# Patient Record
Sex: Female | Born: 2000 | Race: White | Hispanic: No | Marital: Single | Smoking: Current every day smoker
Health system: Southern US, Community
[De-identification: ages and names within clinical notes are randomized; demographics above are authoritative.]

## PROBLEM LIST (undated history)

## (undated) ENCOUNTER — Emergency Department (HOSPITAL_COMMUNITY): Disposition: A | Discharge status: Home / Self Care | Payer: Self-pay

## (undated) DIAGNOSIS — T07XXXA Unspecified multiple injuries, initial encounter: Secondary | ICD-10-CM

## (undated) DIAGNOSIS — T148XXA Other injury of unspecified body region, initial encounter: Secondary | ICD-10-CM

---
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
## 2018-01-01 ENCOUNTER — Other Ambulatory Visit: Payer: Self-pay | Admitting: Family Medicine

## 2018-01-09 ENCOUNTER — Other Ambulatory Visit (HOSPITAL_COMMUNITY): Payer: Self-pay | Admitting: Psychiatry

## 2018-01-28 ENCOUNTER — Telehealth: Payer: Self-pay | Admitting: Family Medicine

## 2018-01-28 NOTE — Telephone Encounter (Signed)
Left msg for pt to call and schedule

## 2018-01-28 NOTE — Telephone Encounter (Signed)
You are overdue for some basic health care including physical and some screening test.  Please make a follow-up appointment with me in the near future.

## 2018-01-28 NOTE — Telephone Encounter (Signed)
Please schedule

## 2018-02-02 NOTE — Telephone Encounter (Signed)
Can you please assist in following up?  Thanks!!

## 2018-02-10 NOTE — Telephone Encounter (Signed)
Left pt to call me back on my extension.

## 2018-03-15 ENCOUNTER — Ambulatory Visit (INDEPENDENT_AMBULATORY_CARE_PROVIDER_SITE_OTHER): Payer: Managed Care, Other (non HMO) | Admitting: Family Medicine

## 2018-03-15 ENCOUNTER — Ambulatory Visit (INDEPENDENT_AMBULATORY_CARE_PROVIDER_SITE_OTHER): Payer: Managed Care, Other (non HMO)

## 2018-03-15 ENCOUNTER — Encounter: Payer: Self-pay | Admitting: Family Medicine

## 2018-03-15 VITALS — BP 117/73 | HR 89 | Temp 98.2°F | Wt 121.0 lb

## 2018-03-15 DIAGNOSIS — Z113 Encounter for screening for infections with a predominantly sexual mode of transmission: Secondary | ICD-10-CM | POA: Diagnosis not present

## 2018-03-15 DIAGNOSIS — R05 Cough: Secondary | ICD-10-CM

## 2018-03-15 DIAGNOSIS — F3181 Bipolar II disorder: Secondary | ICD-10-CM

## 2018-03-15 DIAGNOSIS — R059 Cough, unspecified: Secondary | ICD-10-CM

## 2018-03-15 MED ORDER — ALBUTEROL SULFATE HFA 108 (90 BASE) MCG/ACT IN AERS: 2.0000 | INHALATION_SPRAY | Freq: Four times a day (QID) | RESPIRATORY_TRACT | 0 refills | Status: DC | PRN

## 2018-03-15 MED ORDER — BENZONATATE 200 MG PO CAPS: 200.0000 mg | ORAL_CAPSULE | Freq: Three times a day (TID) | ORAL | 0 refills | Status: DC | PRN

## 2018-03-15 MED ORDER — PREDNISONE 10 MG PO TABS: 30.0000 mg | ORAL_TABLET | Freq: Every day | ORAL | 0 refills | Status: DC

## 2018-03-15 MED ORDER — AZITHROMYCIN 250 MG PO TABS: 250.0000 mg | ORAL_TABLET | Freq: Every day | ORAL | 0 refills | Status: DC

## 2018-03-15 MED ORDER — IPRATROPIUM BROMIDE 0.06 % NA SOLN: 2.0000 | NASAL | 6 refills | Status: DC | PRN

## 2018-03-15 NOTE — Patient Instructions (Addendum)
Thank you for coming in today. Call or go to the emergency room if you get worse, have trouble breathing, have chest pains, or palpitations.  Use albuterol as needed.  Use tessalon pearles as needed for cough.  .Take prednisone for lung inflimation.  Back up azithromycin antibiotic if not better.   Recheck in a few weeks.  Return sooner if needed.    Acute Bronchitis, Adult Acute bronchitis is when air tubes (bronchi) in the lungs suddenly get swollen. The condition can make it hard to breathe. It can also cause these symptoms:  A cough.  Coughing up clear, yellow, or green mucus.  Wheezing.  Chest congestion.  Shortness of breath.  A fever.  Body aches.  Chills.  A sore throat.  Follow these instructions at home: Medicines  Take over-the-counter and prescription medicines only as told by your doctor.  If you were prescribed an antibiotic medicine, take it as told by your doctor. Do not stop taking the antibiotic even if you start to feel better. General instructions  Rest.  Drink enough fluids to keep your pee (urine) clear or pale yellow.  Avoid smoking and secondhand smoke. If you smoke and you need help quitting, ask your doctor. Quitting will help your lungs heal faster.  Use an inhaler, cool mist vaporizer, or humidifier as told by your doctor.  Keep all follow-up visits as told by your doctor. This is important. How is this prevented? To lower your risk of getting this condition again:  Wash your hands often with soap and water. If you cannot use soap and water, use hand sanitizer.  Avoid contact with people who have cold symptoms.  Try not to touch your hands to your mouth, nose, or eyes.  Make sure to get the flu shot every year.  Contact a doctor if:  Your symptoms do not get better in 2 weeks. Get help right away if:  You cough up blood.  You have chest pain.  You have very bad shortness of breath.  You become dehydrated.  You faint  (pass out) or keep feeling like you are going to pass out.  You keep throwing up (vomiting).  You have a very bad headache.  Your fever or chills gets worse. This information is not intended to replace advice given to you by your health care provider. Make sure you discuss any questions you have with your health care provider. Document Released: 11/04/2007 Document Revised: 12/25/2015 Document Reviewed: 11/06/2015 Elsevier Interactive Patient Education  [employer].

## 2018-03-15 NOTE — Progress Notes (Signed)
Tracey [name] is a 17 y.o. female who presents to [name] today for cough congestion runny nose.  Cough is productive and frequent interfering with sleep.  Patient notes some shortness of breath.  She notes that her friend is sick with a similar illness and recently was diagnosed with pneumonia.  No vomiting or diarrhea chest pain or palpitations.   ROS as above:  Exam:  BP 117/73   Pulse 89   Temp 98.2 F (36.8 C) (Oral)   Wt 121 lb (54.9 kg)   SpO2 99%   BMI 21.43 kg/m  Wt Readings from Last 5 Encounters:  03/15/18 121 lb (54.9 kg) (37 %, Z= -0.32)*  05/31/17 143 lb (64.9 kg) (77 %, Z= 0.73)*  05/13/17 142 lb (64.4 kg) (76 %, Z= 0.70)*  04/26/17 144 lb (65.3 kg) (78 %, Z= 0.77)*  11/24/16 127 lb (57.6 kg) (56 %, Z= 0.14)*   * Growth percentiles are based on CDC (Girls, 2-20 Years) data.    Gen: Well NAD HEENT: EOMI,  MMM clear nasal discharge.  Posterior pharynx with cobblestoning.  Inflamed nasal turbinates bilaterally.  Tympanic membranes are normal-appearing bilaterally.  Cervical lymphadenopathy is present bilaterally. Lungs: Normal work of breathing.  Coarse breath sounds present bilaterally rhonchi right lower lung fields.  Frequent coughing present. Heart: RRR no MRG Abd: NABS, Soft. Nondistended, Nontender Exts: Brisk capillary refill, warm and well perfused.   Lab and Radiology Results  Dg Chest 2 View  Result Date: 03/15/2018 CLINICAL DATA:  Cough EXAM: CHEST - 2 VIEW COMPARISON:  None. FINDINGS: The heart size and mediastinal contours are within normal limits. Both lungs are clear. The visualized skeletal structures are unremarkable. IMPRESSION: No active cardiopulmonary disease. Electronically Signed   By: Jasmine Pang M.D.   On: 03/15/2018 22:59   I personally (independently) visualized and performed the interpretation of the images  attached in this note.  Assessment and Plan: 17 y.o. female with cough and congestion likely viral etiology.  Plan for continued over-the-counter medications.  Will use prednisone Tessalon Perles and albuterol for symptom control.  Back-up azithromycin prescribed if patient does not improve.  The patient is overdue for follow-up regarding her bipolar disorder as well as several screening tests including chlamydia screening.  Urine sample for chlamydia screening obtained today and patient will follow-up in a few weeks for recheck cough as well as follow-up her other medical problems.  Return sooner if needed.   Orders Placed This Encounter  Procedures  . C. trachomatis/N. gonorrhoeae RNA  . DG Chest 2 View    Order Specific Question:   Reason for exam:    Answer:   Cough, assess intra-thoracic pathology    Order Specific Question:   Is the patient pregnant?    Answer:   No    Order Specific Question:   Preferred imaging location?    Answer:   [facility]   Meds ordered this encounter  Medications  . predniSONE (DELTASONE) 10 MG tablet    Sig: Take 3 tablets (30 mg total) by mouth daily with breakfast.    Dispense:  15 tablet    Refill:  0  . azithromycin (ZITHROMAX) 250 MG tablet    Sig: Take 1 tablet (250 mg total) by mouth daily. Take first 2 tablets together, then 1 every day until finished.    Dispense:  6 tablet    Refill:  0  . benzonatate (TESSALON) 200  MG capsule    Sig: Take 1 capsule (200 mg total) by mouth 3 (three) times daily as needed for cough.    Dispense:  45 capsule    Refill:  0  . albuterol (PROVENTIL HFA;VENTOLIN HFA) 108 (90 Base) MCG/ACT inhaler    Sig: Inhale 2 puffs into the lungs every 6 (six) hours as needed for wheezing or shortness of breath.    Dispense:  1 Inhaler    Refill:  0  . ipratropium (ATROVENT) 0.06 % nasal spray    Sig: Place 2 sprays into both nostrils every 4 (four) hours as needed.    Dispense:  10 mL    Refill:  6      Historical information moved to improve visibility of documentation.  Past Medical History:  Diagnosis Date  . Fractures   . Sprains and strains    No past surgical history on file. Social History   Tobacco Use  . Smoking status: Current Every Day Smoker    Packs/day: 0.25  . Smokeless tobacco: Never Used  . Tobacco comment: 1 or 2 daily  Substance Use Topics  . Alcohol use: No   family history includes Bipolar disorder in her brother and mother.  Medications: Current Outpatient Medications  Medication Sig Dispense Refill  . busPIRone (BUSPAR) 7.5 MG tablet Take 1 tablet (7.5 mg total) by mouth 2 (two) times daily. 180 tablet 0  . divalproex (DEPAKOTE ER) 250 MG 24 hr tablet Take one during the day . 2 at night  . Total dose of 750mg  270 tablet 0  . PREVIFEM 0.25-35 MG-MCG tablet TAKE 1 TABLET DAILY AS     DIRECTED MAKE AN           APPOINTMENT FORFURTHER    REFILLS 30 tablet 0  . albuterol (PROVENTIL HFA;VENTOLIN HFA) 108 (90 Base) MCG/ACT inhaler Inhale 2 puffs into the lungs every 6 (six) hours as needed for wheezing or shortness of breath. 1 Inhaler 0  . azithromycin (ZITHROMAX) 250 MG tablet Take 1 tablet (250 mg total) by mouth daily. Take first 2 tablets together, then 1 every day until finished. 6 tablet 0  . benzonatate (TESSALON) 200 MG capsule Take 1 capsule (200 mg total) by mouth 3 (three) times daily as needed for cough. 45 capsule 0  . ipratropium (ATROVENT) 0.06 % nasal spray Place 2 sprays into both nostrils every 4 (four) hours as needed. 10 mL 6  . predniSONE (DELTASONE) 10 MG tablet Take 3 tablets (30 mg total) by mouth daily with breakfast. 15 tablet 0   No current facility-administered medications for this visit.    No Known Allergies   Discussed warning signs or symptoms. Please see discharge instructions. Patient expresses understanding.

## 2018-03-15 NOTE — Progress Notes (Signed)
Cough

## 2018-04-05 ENCOUNTER — Ambulatory Visit: Payer: Self-pay | Admitting: Family Medicine

## 2018-04-07 ENCOUNTER — Other Ambulatory Visit: Payer: Self-pay | Admitting: Family Medicine

## 2018-05-08 ENCOUNTER — Other Ambulatory Visit: Payer: Self-pay | Admitting: Family Medicine

## 2018-05-27 ENCOUNTER — Other Ambulatory Visit: Payer: Self-pay | Admitting: Family Medicine

## 2018-06-13 ENCOUNTER — Other Ambulatory Visit: Payer: Self-pay | Admitting: Family Medicine

## 2018-06-23 LAB — CBC AND DIFFERENTIAL: Hemoglobin: 10.7 — AB (ref 12.0–16.0)

## 2018-07-29 ENCOUNTER — Encounter: Payer: Self-pay | Admitting: Family Medicine

## 2018-07-29 ENCOUNTER — Ambulatory Visit (INDEPENDENT_AMBULATORY_CARE_PROVIDER_SITE_OTHER): Payer: BLUE CROSS/BLUE SHIELD | Admitting: Family Medicine

## 2018-07-29 VITALS — BP 120/80 | HR 62 | Wt 139.0 lb

## 2018-07-29 DIAGNOSIS — Z113 Encounter for screening for infections with a predominantly sexual mode of transmission: Secondary | ICD-10-CM

## 2018-07-29 DIAGNOSIS — N939 Abnormal uterine and vaginal bleeding, unspecified: Secondary | ICD-10-CM

## 2018-07-29 DIAGNOSIS — N926 Irregular menstruation, unspecified: Secondary | ICD-10-CM | POA: Diagnosis not present

## 2018-07-29 LAB — POCT URINE PREGNANCY: Preg Test, Ur: NEGATIVE

## 2018-07-29 MED ORDER — DROSPIRENONE-ETHINYL ESTRADIOL 3-0.03 MG PO TABS: 1.0000 | ORAL_TABLET | Freq: Every day | ORAL | 11 refills | Status: DC

## 2018-07-29 NOTE — Patient Instructions (Signed)
Thank you for coming in today. Get labs now.  STOP tri-sprintec.  START yasmen.  Follow up with OBGYN or let me know if you do not do well.    Menorrhagia  Menorrhagia is a condition in which menstrual periods are heavy or last longer than normal. With menorrhagia, most periods a woman has may cause enough blood loss and cramping that she becomes unable to take part in her usual activities. What are the causes? Common causes of this condition include:  Noncancerous growths in the uterus (polyps or fibroids).  An imbalance of the estrogen and progesterone hormones.  One of the ovaries not releasing an egg during one or more months.  A problem with the thyroid gland (hypothyroid).  Side effects of having an intrauterine device (IUD).  Side effects of some medicines, such as anti-inflammatory medicines or blood thinners.  A bleeding disorder that stops the blood from clotting normally. In some cases, the cause of this condition is not known. What are the signs or symptoms? Symptoms of this condition include:  Routinely having to change your pad or tampon every 1-2 hours because it is completely soaked.  Needing to use pads and tampons at the same time because of heavy bleeding.  Needing to wake up to change your pads or tampons during the night.  Passing blood clots larger than 1 inch (2.5 cm) in size.  Having bleeding that lasts for more than 7 days.  Having symptoms of low iron levels (anemia), such as tiredness, fatigue, or shortness of breath. How is this diagnosed? This condition may be diagnosed based on:  A physical exam.  Your symptoms and menstrual history.  Tests, such as: ? Blood tests to check if you are pregnant or have hormonal changes, a bleeding or thyroid disorder, anemia, or other problems. ? Pap test to check for cancerous changes, infections, or inflammation. ? Endometrial biopsy. This test involves removing a tissue sample from the lining of the  uterus (endometrium) to be examined under a microscope. ? Pelvic ultrasound. This test uses sound waves to create images of your uterus, ovaries, and vagina. The images can show if you have fibroids or other growths. ? Hysteroscopy. For this test, a small telescope is used to look inside your uterus. How is this treated? Treatment may not be needed for this condition. If it is needed, the best treatment for you will depend on:  Whether you need to prevent pregnancy.  Your desire to have children in the future.  The cause and severity of your bleeding.  Your personal preference. Medicines are the first step in treatment. You may be treated with:  Hormonal birth control methods. These treatments reduce bleeding during your menstrual period. They include: ? Birth control pills. ? Skin patch. ? Vaginal ring. ? Shots (injections) that you get every 3 months. ? Hormonal IUD (intrauterine device). ? Implants that go under the skin.  Medicines that thicken blood and slow bleeding.  Medicines that reduce swelling, such as ibuprofen.  Medicines that contain an artificial (synthetic) hormone called progestin.  Medicines that make the ovaries stop working for a short time.  Iron supplements to treat anemia. If medicines do not work, surgery may be done. Surgical options may include:  Dilation and curettage (D&C). In this procedure, your health care provider opens (dilates) your cervix and then scrapes or suctions tissue from the endometrium to reduce menstrual bleeding.  Operative hysteroscopy. In this procedure, a small tube with a light on the end (  hysteroscope) is used to view your uterus and help remove polyps that may be causing heavy periods.  Endometrial ablation. This is when various techniques are used to permanently destroy your entire endometrium. After endometrial ablation, most women have little or no menstrual flow. This procedure reduces your ability to become  pregnant.  Endometrial resection. In this procedure, an electrosurgical wire loop is used to remove the endometrium. This procedure reduces your ability to become pregnant.  Hysterectomy. This is surgical removal of the uterus. This is a permanent procedure that stops menstrual periods. Pregnancy is not possible after a hysterectomy. Follow these instructions at home: Medicines  Take over-the-counter and prescription medicines exactly as told by your health care provider. This includes iron pills.  Do not change or switch medicines without asking your health care provider.  Do not take aspirin or medicines that contain aspirin 1 week before or during your menstrual period. Aspirin may make bleeding worse. General instructions  If you need to change your sanitary pad or tampon more than once every 2 hours, limit your activity until the bleeding stops.  Iron pills can cause constipation. To prevent or treat constipation while you are taking prescription iron supplements, your health care provider may recommend that you: ? Drink enough fluid to keep your urine clear or pale yellow. ? Take over-the-counter or prescription medicines. ? Eat foods that are high in fiber, such as fresh fruits and vegetables, whole grains, and beans. ? Limit foods that are high in fat and processed sugars, such as fried and sweet foods.  Eat well-balanced meals, including foods that are high in iron. Foods that have a lot of iron include leafy green vegetables, meat, liver, eggs, and whole grain breads and cereals.  Do not try to lose weight until the abnormal bleeding has stopped and your blood iron level is back to normal. If you need to lose weight, work with your health care provider to lose weight safely.  Keep all follow-up visits as told by your health care provider. This is important. Contact a health care provider if:  You soak through a pad or tampon every 1 or 2 hours, and this happens every time you  have a period.  You need to use pads and tampons at the same time because you are bleeding so much.  You have nausea, vomiting, diarrhea, or other problems related to medicines you are taking. Get help right away if:  You soak through more than a pad or tampon in 1 hour.  You pass clots bigger than 1 inch (2.5 cm) wide.  You feel short of breath.  You feel like your heart is beating too fast.  You feel dizzy or faint.  You feel very weak or tired. Summary  Menorrhagia is a condition in which menstrual periods are heavy or last longer than normal.  Treatment will depend on the cause of the condition and may include medicines or procedures.  Take over-the-counter and prescription medicines exactly as told by your health care provider. This includes iron pills.  Get help right away if you have heavy bleeding that soaks through more than a pad or tampon in 1 hour, you are passing large clots, or you feel dizzy, faint or short of breath. This information is not intended to replace advice given to you by your health care provider. Make sure you discuss any questions you have with your health care provider. Document Released: 05/18/2005 Document Revised: 05/11/2016 Document Reviewed: 05/11/2016 Elsevier Interactive Patient Education  2019 [place].

## 2018-07-29 NOTE — Progress Notes (Signed)
Tracey [name] is a 18 y.o. female who presents to [name] today for heavy vaginal bleeding.  Tracey [name] has had ongoing vaginal bleeding for the past 2 months.  She has been taking Sprintec continuously which has not helped.  Patient was seen by her OB/GYN on January 23 and February 5.  She has been having vaginal bleeding and was switched to Tri-Lo-Sprintec after having breakthrough bleeding with Sprintec.  Point-of-care hemoglobin was 10.7.  Urine pregnancy was negative.  She notes some cramping but denies significant abdominal pain.  She notes that she had a pelvic exam at her OB/GYN recently. ROS as above:  Exam:  BP 120/80   Pulse 62   Wt 139 lb (63 kg)   BMI 24.62 kg/m  Wt Readings from Last 5 Encounters:  07/29/18 139 lb (63 kg) (68 %, Z= 0.47)*  03/15/18 121 lb (54.9 kg) (37 %, Z= -0.32)*  05/31/17 143 lb (64.9 kg) (77 %, Z= 0.73)*  05/13/17 142 lb (64.4 kg) (76 %, Z= 0.70)*  04/26/17 144 lb (65.3 kg) (78 %, Z= 0.77)*   * Growth percentiles are based on CDC (Girls, 2-20 Years) data.    Gen: Well NAD HEENT: EOMI,  MMM Lungs: Normal work of breathing. CTABL Heart: RRR no MRG Abd: NABS, Soft. Nondistended, minimally tender without masses rebound or guarding. Exts: Brisk capillary refill, warm and well perfused.   Lab and Radiology Results Results for orders placed or performed in visit on 07/29/18 (from the past 72 hour(s))  POCT urine pregnancy     Status: None   Collection Time: 07/29/18 11:34 AM  Result Value Ref Range   Preg Test, Ur Negative Negative   No results found.    Assessment and Plan: 18 y.o. female with  Breakthrough vaginal bleeding despite oral contraceptive.  Currently on Tri-Lo Sprintec.  I suspect estrogen progesterone component are probably not enough and will increase total dose by using Yasmin which is a higher  relative dose of estrogens and progesterone's.  Additionally will complete limited metabolic work-up including CBC, CMP, and INR.  Next step if not better would be pelvic ultrasound and further evaluation with OB/GYN.  Additionally would consider Megace.  In the medium to long-term Tracey [name]  may be a good candidate for hormonal containing IUD.  PDMP not reviewed this encounter. Orders Placed This Encounter  Procedures  . C. trachomatis/N. gonorrhoeae RNA  . CBC and differential    This external order was created through the Results Console.  . CBC  . COMPLETE METABOLIC PANEL WITH GFR  . INR/PT  . POCT urine pregnancy   Meds ordered this encounter  Medications  . drospirenone-ethinyl estradiol (YASMIN,ZARAH,SYEDA) 3-0.03 MG tablet    Sig: Take 1 tablet by mouth daily.    Dispense:  1 Package    Refill:  11   Fredia Sorrow  Physician for women P: [phone] F: [phone] [address], [address] [place], [place] [address]    Historical information moved to improve visibility of documentation.  Past Medical History:  Diagnosis Date  . Fractures   . Sprains and strains    No past surgical history on file. Social History   Tobacco Use  . Smoking status: Current Every Day Smoker    Packs/day: 0.25  . Smokeless tobacco: Never Used  . Tobacco comment: 1 or 2 daily  Substance Use Topics  . Alcohol use: No   family  history includes Bipolar disorder in her brother and mother.  Medications: Current Outpatient Medications  Medication Sig Dispense Refill  . albuterol (PROVENTIL HFA;VENTOLIN HFA) 108 (90 Base) MCG/ACT inhaler TAKE 2 PUFFS BY MOUTH EVERY 6 HOURS AS NEEDED FOR WHEEZE OR SHORTNESS OF BREATH 6.7 Inhaler 0  . busPIRone (BUSPAR) 7.5 MG tablet Take 1 tablet (7.5 mg total) by mouth 2 (two) times daily. 180 tablet 0  . Norgestimate-Ethinyl Estradiol Triphasic (TRI-LO-SPRINTEC) 0.18/0.215/0.25 MG-25 MCG tab Tri-Lo-Sprintec 0.18  mg/0.215 mg/0.25 mg-25 mcg tablet    . drospirenone-ethinyl estradiol (YASMIN,ZARAH,SYEDA) 3-0.03 MG tablet Take 1 tablet by mouth daily. 1 Package 11   No current facility-administered medications for this visit.    No Known Allergies   Discussed warning signs or symptoms. Please see discharge instructions. Patient expresses understanding.

## 2018-07-30 LAB — CBC
HEMATOCRIT: 36.4 % (ref 35.0–45.0)
HEMOGLOBIN: 11.9 g/dL (ref 11.7–15.5)
MCH: 29.7 pg (ref 27.0–33.0)
MCHC: 32.7 g/dL (ref 32.0–36.0)
MCV: 90.8 fL (ref 80.0–100.0)
MPV: 9.8 fL (ref 7.5–12.5)
Platelets: 303 10*3/uL (ref 140–400)
RBC: 4.01 10*6/uL (ref 3.80–5.10)
RDW: 12.5 % (ref 11.0–15.0)
WBC: 8 10*3/uL (ref 3.8–10.8)

## 2018-07-30 LAB — COMPLETE METABOLIC PANEL WITH GFR
AG Ratio: 1.6 (calc) (ref 1.0–2.5)
ALT: 7 U/L (ref 5–32)
AST: 12 U/L (ref 12–32)
Albumin: 4.1 g/dL (ref 3.6–5.1)
Alkaline phosphatase (APISO): 46 U/L (ref 36–128)
BUN: 12 mg/dL (ref 7–20)
CHLORIDE: 107 mmol/L (ref 98–110)
CO2: 25 mmol/L (ref 20–32)
Calcium: 9.4 mg/dL (ref 8.9–10.4)
Creat: 0.83 mg/dL (ref 0.50–1.00)
GFR, Est African American: 118 mL/min/{1.73_m2} (ref >=60)
GFR, Est Non African American: 102 mL/min/{1.73_m2} (ref >=60)
Globulin: 2.5 g/dL (calc) (ref 2.0–3.8)
Glucose, Bld: 86 mg/dL (ref 65–99)
Potassium: 4.5 mmol/L (ref 3.8–5.1)
Sodium: 140 mmol/L (ref 135–146)
Total Bilirubin: 0.2 mg/dL (ref 0.2–1.1)
Total Protein: 6.6 g/dL (ref 6.3–8.2)

## 2018-07-30 LAB — PROTIME-INR
INR: 0.9
Prothrombin Time: 9.6 s (ref 9.0–11.5)

## 2018-07-30 LAB — C. TRACHOMATIS/N. GONORRHOEAE RNA
C. trachomatis RNA, TMA: NOT DETECTED
N. gonorrhoeae RNA, TMA: NOT DETECTED

## 2018-08-16 ENCOUNTER — Other Ambulatory Visit: Payer: Self-pay | Admitting: Family Medicine

## 2018-08-16 MED ORDER — DROSPIRENONE-ETHINYL ESTRADIOL 3-0.03 MG PO TABS: 1.0000 | ORAL_TABLET | Freq: Every day | ORAL | 1 refills | Status: DC

## 2018-08-25 ENCOUNTER — Emergency Department (HOSPITAL_COMMUNITY)
Admission: EM | Admit: 2018-08-25 | Discharge: 2018-08-25 | Disposition: A | Discharge status: Home / Self Care | Payer: BLUE CROSS/BLUE SHIELD | Attending: Emergency Medicine | Admitting: Emergency Medicine

## 2018-08-25 DIAGNOSIS — R092 Respiratory arrest: Secondary | ICD-10-CM | POA: Diagnosis not present

## 2018-08-25 DIAGNOSIS — T401X1A Poisoning by heroin, accidental (unintentional), initial encounter: Secondary | ICD-10-CM | POA: Diagnosis not present

## 2018-08-25 MED ORDER — NALOXONE HCL 2 MG/2ML IJ SOSY
PREFILLED_SYRINGE | INTRAMUSCULAR | Status: AC | PRN
  Administered 2018-08-25 (×2): 2 mg via INTRAVENOUS

## 2018-08-25 MED ORDER — NALOXONE HCL 2 MG/2ML IJ SOSY
2.0000 mg | PREFILLED_SYRINGE | Freq: Once | INTRAMUSCULAR | Status: AC
  Administered 2018-08-25: 2 mg via INTRAVENOUS
  Filled 2018-08-25: qty 2

## 2018-08-25 NOTE — ED Triage Notes (Signed)
By passed triage, CPR started as patient got dropped off in front of hospital, unresponsive, pulseless and apneic.   Friends reported that patient had done heroin and Xanax after getting kicked out of her house today.

## 2018-08-25 NOTE — ED Notes (Addendum)
Pt verbalized discharge instructions and follow up care. Alert and ambulatory. No IV. Father is picking pt up in a hour. Pt given water in the meantime with no difficulty swallowing.

## 2018-08-25 NOTE — Code Documentation (Signed)
Patient became responsive, CPR stopped. MD Kohut talking to patient

## 2018-08-25 NOTE — ED Notes (Signed)
Pt gave permission for healthcare to release information to her friends and parents. Father is Eztli Deso. [phone]  He requestes a call in about an hour to update on daughters condition due to Covid visitor restrictions.

## 2018-08-25 NOTE — ED Provider Notes (Signed)
IO LINE INSERTION Date/Time: 08/25/2018 7:27 PM Performed by: Maxwell Caul, PA-C Authorized by: Maxwell Caul, PA-C   Consent:    Consent obtained:  Emergent situation Pre-procedure details:    Site preparation:  Alcohol Anesthesia (see MAR for exact dosages):    Anesthesia method:  None Procedure details:    Insertion site:  L proximal tibia   Insertion device:  Drill device   Insertion: Needle was inserted through the bony cortex     Number of attempts:  1   Insertion confirmation:  Aspiration of blood/marrow, easy infusion of fluids and stability of the needle Post-procedure details:    Secured with:  Elastic bandage, tape and transparent dressing   Patient tolerance of procedure:  Tolerated well, no immediate complications      Tracey [name] 08/25/18 Frutoso Chase, MD 08/25/18 2006

## 2018-08-25 NOTE — ED Provider Notes (Signed)
[facility] DEPT Provider Note   CSN: [id number] Arrival date & time: 08/25/18  1613    History   Chief Complaint No chief complaint on file.   HPI Tracey [name] is a 18 y.o. female.     HPI   44yF with drug overdose. She was brought in by private vehicle unresponsive. ER staff went to the parking lot to remove her from the car and onto a stretcher. I then met them outside with a BVM. She was apneic and pulseless. CPR was started as she was wheeled into a resuscitation room. The person in the car said she did heroin and xanax. The nursing staff did an awesome job of establishing and IV as quick as an IO was inserted into her R tibia. 20m of narcan was given IV as CPR continued and we were setting up for possible intubation. Pt had a brisk clinical response to narcan and began breathing again and had ROSC. She admitted to heroin and xanax usage. She denies she was trying to hurt herself.   No past medical history on file.  There are no active problems to display for this patient.      OB History   No obstetric history on file.      Home Medications    Prior to Admission medications   Not on File    Family History No family history on file.  Social History Social History   Tobacco Use  . Smoking status: Not on file  Substance Use Topics  . Alcohol use: Not on file  . Drug use: Not on file     Allergies   Patient has no allergy information on record.   Review of Systems Review of Systems   Level 5 caveat because pt is unresponsive.   Physical Exam Updated Vital Signs There were no vitals taken for this visit.  Physical Exam Vitals signs and nursing note reviewed.  Constitutional:      General: She is in acute distress.     Appearance: She is well-developed. She is ill-appearing.  HENT:     Head: Normocephalic and atraumatic.  Eyes:     General:        Right eye: No discharge.        Left eye: No discharge.   Conjunctiva/sclera: Conjunctivae normal.  Neck:     Musculoskeletal: Neck supple.  Cardiovascular:     Comments: Extremities warm to touch but no palpable pulses. Pulmonary:     Comments: Apneic Abdominal:     General: There is no distension.     Palpations: Abdomen is soft.     Tenderness: There is no abdominal tenderness.  Musculoskeletal:        General: No tenderness.  Skin:    General: Skin is warm and dry.  Neurological:     Comments: GCS 3      ED Treatments / Results  Labs (all labs ordered are listed, but only abnormal results are displayed) Labs Reviewed - No data to display  EKG None  Radiology No results found.  Procedures Procedures (including critical care time)  CRITICAL CARE Performed by: SVirgel ManifoldTotal critical care time: 35 minutes Critical care time was exclusive of separately billable procedures and treating other patients. Critical care was necessary to treat or prevent imminent or life-threatening deterioration. Critical care was time spent personally by me on the following activities: development of treatment plan with patient and/or surrogate as well as nursing, discussions with consultants,  evaluation of patient's response to treatment, examination of patient, obtaining history from patient or surrogate, ordering and performing treatments and interventions, ordering and review of laboratory studies, ordering and review of radiographic studies, pulse oximetry and re-evaluation of patient's condition.   Medications Ordered in ED Medications  naloxone (NARCAN) injection (2 mg Intravenous Given 08/25/18 1615)     Initial Impression / Assessment and Plan / ED Course  I have reviewed the triage vital signs and the nursing notes.  Pertinent labs & imaging results that were available during my care of the patient were reviewed by me and considered in my medical decision making (see chart for details).        18 year old female with  unintentional drug overdose.  She arrived unresponsive.  We are able to establish an IV while we are giving her assisted ventilations and CPR.  She responded briskly to Narcan.  She was observed for several more hours with continued improvement of her symptoms.  She continues to deny intentionally trying to harm her self.  She will be given outpatient resources.  Final Clinical Impressions(s) / ED Diagnoses   Final diagnoses:  Accidental overdose of heroin, initial encounter [facility])  Respiratory arrest [facility])    ED Discharge Orders    None       Virgel Manifold, MD 08/29/18 [phone]

## 2018-08-25 NOTE — Code Documentation (Signed)
Patient was brought in by friends unresponsive. Pt brought back to resus room and CPR started. Respiratory paged.

## 2018-08-25 NOTE — Discharge Instructions (Signed)
You almost killed yourself today. Your heart wasn't beating and you were not breathing. If you received medical care any later than you did, you would have died. Get clean.

## 2018-08-30 ENCOUNTER — Telehealth: Payer: Self-pay | Admitting: General Practice

## 2018-08-30 MED ORDER — DROSPIRENONE-ETHINYL ESTRADIOL 3-0.03 MG PO TABS: 1.0000 | ORAL_TABLET | Freq: Every day | ORAL | 1 refills | Status: DC

## 2018-08-30 NOTE — Telephone Encounter (Signed)
Pt.'s father called and stated that patient is going into treatment center and needs refill of birth control sent to pharmacy to take with her. Refill sent to pharmacy.

## 2019-02-10 ENCOUNTER — Other Ambulatory Visit: Payer: Self-pay | Admitting: Osteopathic Medicine

## 2019-02-10 MED ORDER — DROSPIRENONE-ETHINYL ESTRADIOL 3-0.03 MG PO TABS: 1.0000 | ORAL_TABLET | Freq: Every day | ORAL | 0 refills | Status: DC

## 2019-02-10 NOTE — Telephone Encounter (Signed)
It was approved by Dr. Sheppard Coil to send in 30 days of birth control and I will have the front office make a call to follow up.

## 2019-03-15 IMAGING — DX DG WRIST COMPLETE 3+V*R*
4 series · 4 of 4 positions shown · non-contrast
Comparison: None in PACs

CLINICAL DATA: Status post fall on outstretched hand several days
ago; the patient reports pain over the distal right radius.

EXAM:
RIGHT WRIST - COMPLETE 3+ VIEW

[wrist pa]
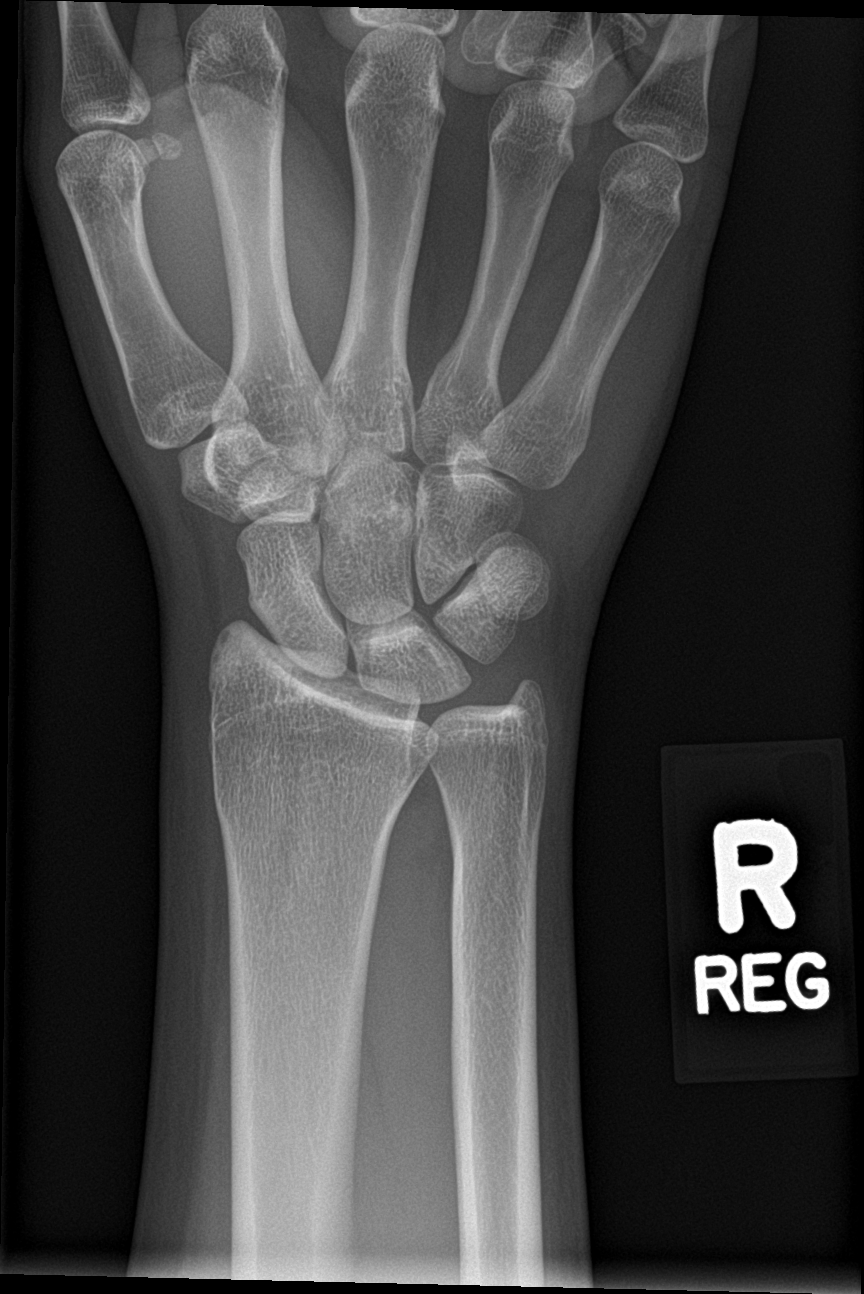

[wrist obl]
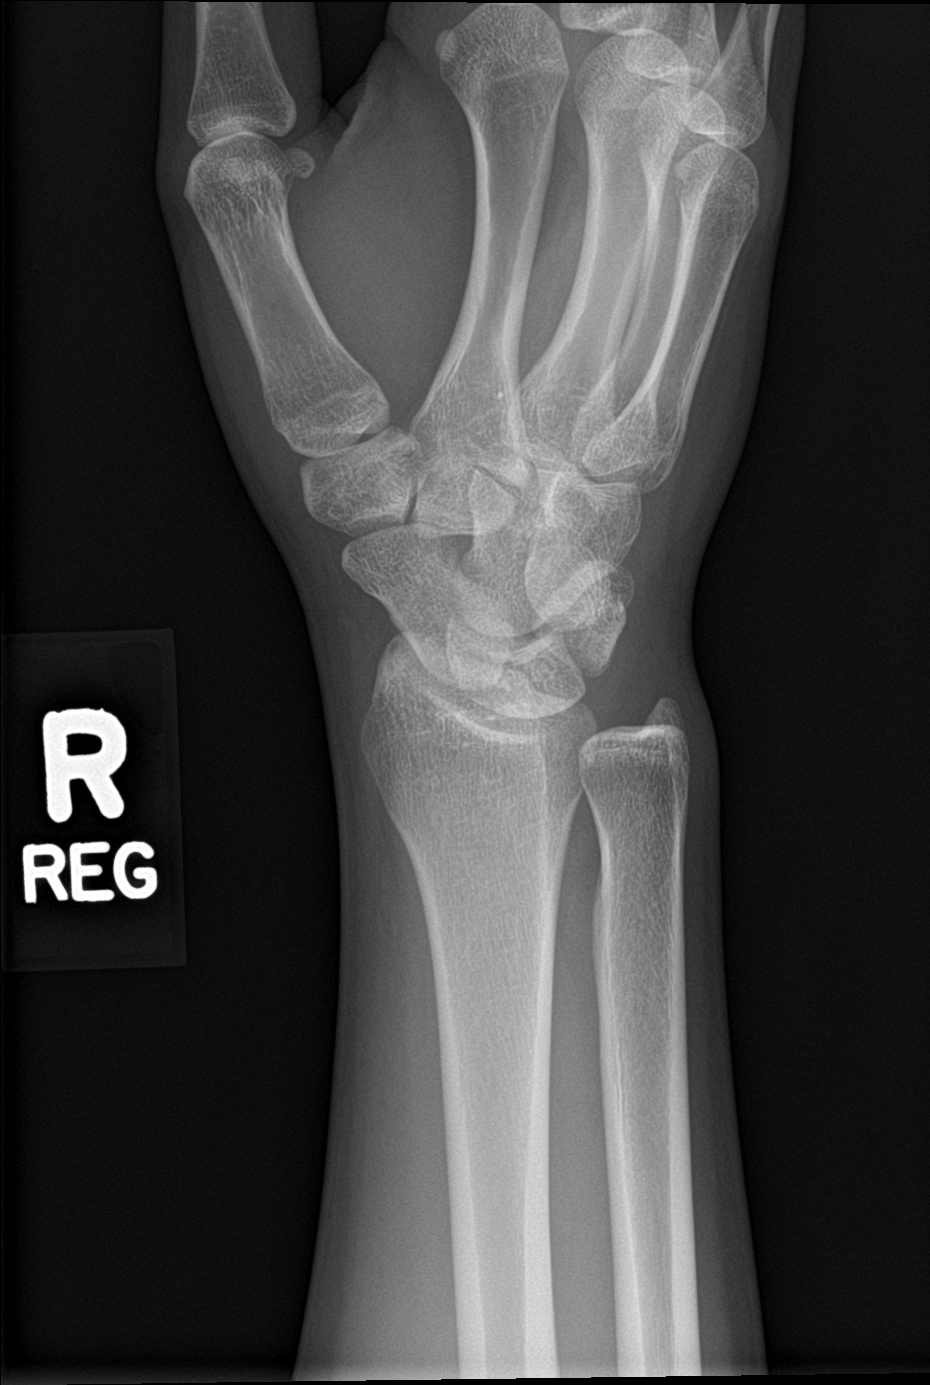

[wrist lat]
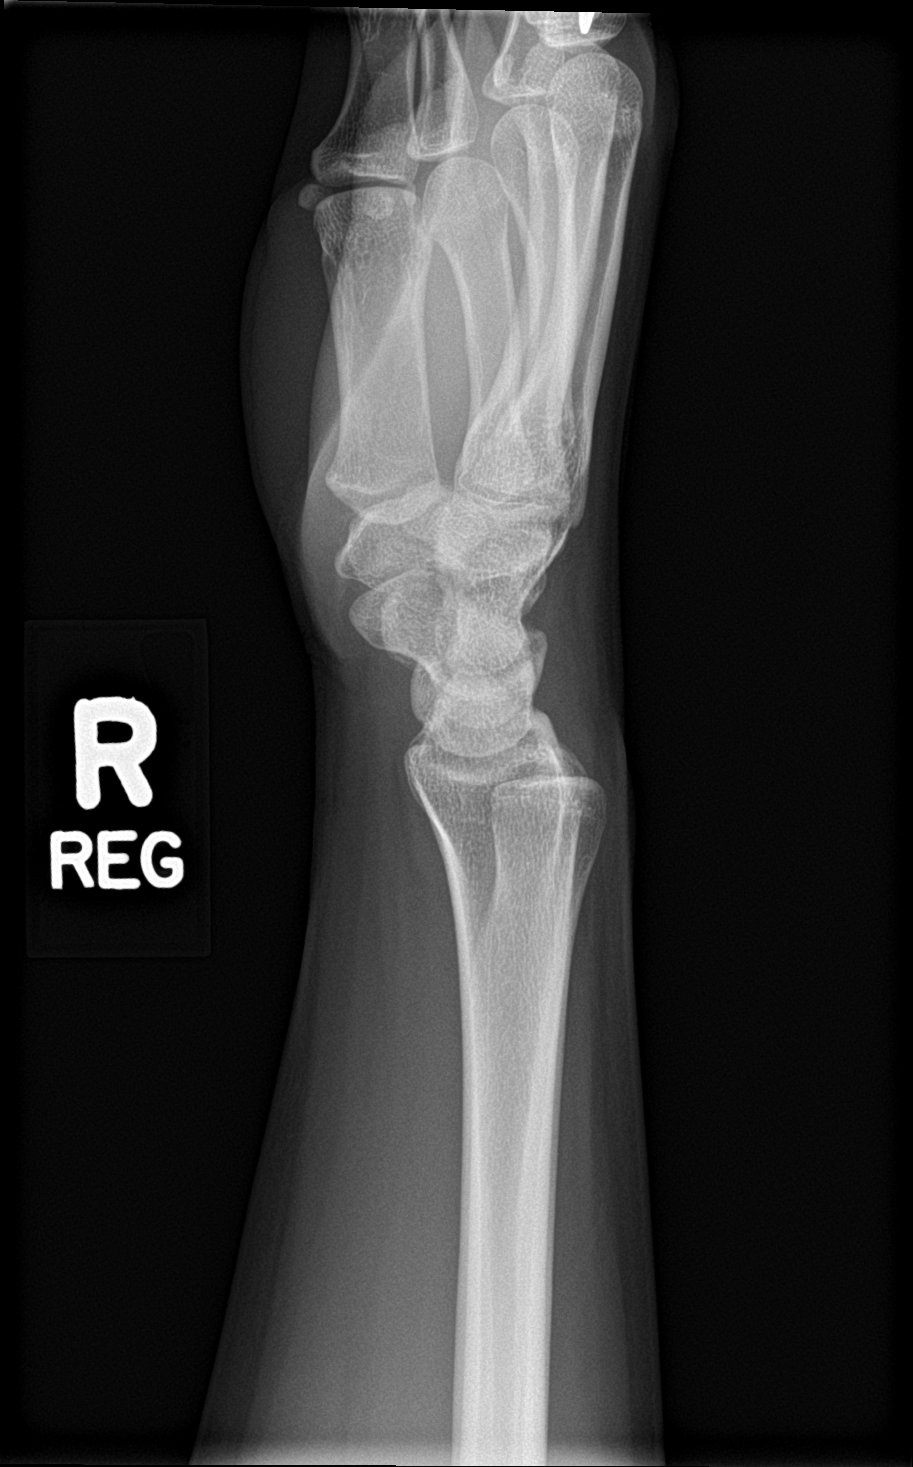

[wrist navicular]
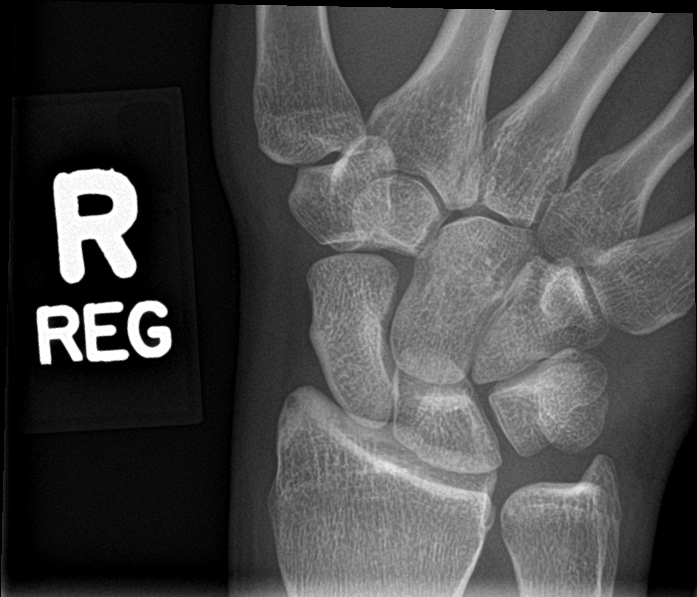

[4 of 4 positions shown; findings below may reference images not displayed]

FINDINGS: The bones are subjectively adequately mineralized. There is no acute
fracture nor dislocation. The joint spaces are well maintained. The
overlying soft tissues are unremarkable.
IMPRESSION: There is no acute or significant chronic bony abnormality of the
right wrist.

## 2019-05-12 ENCOUNTER — Other Ambulatory Visit: Payer: Self-pay | Admitting: Osteopathic Medicine

## 2019-05-15 NOTE — Telephone Encounter (Signed)
Not seen since 2018, ok to refill this time for 90 days but need visit

## 2019-08-02 ENCOUNTER — Other Ambulatory Visit: Payer: Self-pay | Admitting: Osteopathic Medicine

## 2019-10-20 ENCOUNTER — Other Ambulatory Visit: Payer: Self-pay | Admitting: Sports Medicine

## 2019-11-06 ENCOUNTER — Other Ambulatory Visit: Payer: Self-pay | Admitting: Sports Medicine

## 2022-06-08 ENCOUNTER — Other Ambulatory Visit: Payer: Self-pay | Admitting: Family Medicine

## 2022-06-08 ENCOUNTER — Other Ambulatory Visit (HOSPITAL_COMMUNITY)
Admission: RE | Admit: 2022-06-08 | Discharge: 2022-06-08 | Disposition: A | Discharge status: Home / Self Care | Payer: BC Managed Care – PPO | Source: Ambulatory Visit | Attending: Family Medicine | Admitting: Family Medicine

## 2022-06-08 DIAGNOSIS — Z01419 Encounter for gynecological examination (general) (routine) without abnormal findings: Secondary | ICD-10-CM | POA: Insufficient documentation

## 2022-06-17 LAB — CYTOLOGY - PAP
Comment: NEGATIVE
Diagnosis: NEGATIVE
Diagnosis: NEGATIVE
High risk HPV: NEGATIVE
